# Patient Record
Sex: Male | Born: 1960 | Hispanic: No | Smoking: Never smoker
Health system: Southern US, Community
[De-identification: ages and names within clinical notes are randomized; demographics above are authoritative.]

---
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
## 1982-01-19 HISTORY — PX: NASAL SEPTUM SURGERY: SHX37

## 2012-10-12 ENCOUNTER — Institutional Professional Consult (permissible substitution): Payer: Self-pay | Admitting: Sports Medicine

## 2012-10-13 ENCOUNTER — Encounter: Payer: Self-pay | Admitting: Sports Medicine

## 2012-10-14 ENCOUNTER — Ambulatory Visit (INDEPENDENT_AMBULATORY_CARE_PROVIDER_SITE_OTHER): Payer: BC Managed Care – PPO

## 2012-10-14 ENCOUNTER — Ambulatory Visit (INDEPENDENT_AMBULATORY_CARE_PROVIDER_SITE_OTHER): Payer: BC Managed Care – PPO | Admitting: Sports Medicine

## 2012-10-14 ENCOUNTER — Encounter: Payer: Self-pay | Admitting: Sports Medicine

## 2012-10-14 ENCOUNTER — Telehealth: Payer: Self-pay | Admitting: *Deleted

## 2012-10-14 VITALS — BP 128/85 | HR 84 | Wt 217.0 lb

## 2012-10-14 DIAGNOSIS — S62143A Displaced fracture of body of hamate [unciform] bone, unspecified wrist, initial encounter for closed fracture: Secondary | ICD-10-CM

## 2012-10-14 DIAGNOSIS — W19XXXA Unspecified fall, initial encounter: Secondary | ICD-10-CM

## 2012-10-14 DIAGNOSIS — M79642 Pain in left hand: Secondary | ICD-10-CM

## 2012-10-14 DIAGNOSIS — M79609 Pain in unspecified limb: Secondary | ICD-10-CM

## 2012-10-14 DIAGNOSIS — M259 Joint disorder, unspecified: Secondary | ICD-10-CM

## 2012-10-14 DIAGNOSIS — S62152A Displaced fracture of hook process of hamate [unciform] bone, left wrist, initial encounter for closed fracture: Secondary | ICD-10-CM | POA: Insufficient documentation

## 2012-10-14 MED ORDER — CELECOXIB 200 MG PO CAPS: ORAL_CAPSULE | ORAL | Status: DC

## 2012-10-14 NOTE — Assessment & Plan Note (Addendum)
Pain is localized on the volar aspect near the scapholunate joint. On his x-rays I do see what may be a small chip on the medial aspect of the scaphoid. This may represent refraction artifact, I do need a CT scan of his wrist. Velcro wrist brace. Celebrex. Return in 2 weeks is still having pain.  CT scan of the wrist review, there is a fracture through the hook of the hamate, scaphoid appears normal. He will return for cast placement next week, hamate fractures can remain chronically symptomatic, if no improvement after 6-8 weeks of immobilization we can consider referral for excision of hook of the hamate.  I billed a fracture code for this visit, all subsequent visits for this complaint will be "post-op checks" in the global period.

## 2012-10-14 NOTE — Progress Notes (Addendum)
   Subjective:    I'm seeing this patient as a consultation for:  Dr. Angelena Sole  CC: Left hand injury  HPI: This is a very pleasant 52 year old male, approximately 1-1/2 weeks ago he fell onto an outstretched hand, impacting his extended dorsiflexed wrist on the pavement. He also scraped up his left knee, which is doing well. Now he has persistent pain, not much better, with swelling over the volar proximal hand approximately 1-1-1/2 cm distal to the proximal wrist crease. He did have x-rays that were done and read by the radiologist as negative. Pain is moderate, persistent, localized.  Past medical history, Surgical history, Family history not pertinant except as noted below, Social history, Allergies, and medications have been entered into the medical record, reviewed, and no changes needed.   Review of Systems: No headache, visual changes, nausea, vomiting, diarrhea, constipation, dizziness, abdominal pain, skin rash, fevers, chills, night sweats, weight loss, swollen lymph nodes, body aches, joint swelling, muscle aches, chest pain, shortness of breath, mood changes, visual or auditory hallucinations.   Objective:   General: Well Developed, well nourished, and in no acute distress.  Neuro/Psych: Alert and oriented x3, extra-ocular muscles intact, able to move all 4 extremities, sensation grossly intact. Skin: Warm and dry, no rashes noted.  Respiratory: Not using accessory muscles, speaking in full sentences, trachea midline.  Cardiovascular: Pulses palpable, no extremity edema. Abdomen: Does not appear distended. Left hand: There is mild swelling over the thenar and hyperthenar eminence, there is tenderness to palpation approximately 1 cm distal to the mid portion of the proximal wrist crease approximately in line with the palmaris longus. No pain in the snuff box, negative Watson's test. Neurovascularly intact distally.  X-rays were personally reviewed, there does appear to be a  small chip on the medial aspect of the scaphoid near the lunate. It is unclear as to whether this represents refraction artifact.  I reviewed the CT scan personally, there is a nondisplaced fracture through the hook of the hamate bone.  Impression and Recommendations:   This case required medical decision making of moderate complexity.

## 2012-10-14 NOTE — Telephone Encounter (Signed)
PA obtained for CT wrist LT w/o contrast.  Auth # [id number].  Meyer Cory, LPN

## 2012-10-17 ENCOUNTER — Encounter: Payer: Self-pay | Admitting: Sports Medicine

## 2012-10-17 ENCOUNTER — Ambulatory Visit (INDEPENDENT_AMBULATORY_CARE_PROVIDER_SITE_OTHER): Payer: BC Managed Care – PPO | Admitting: Sports Medicine

## 2012-10-17 VITALS — BP 150/94 | HR 107 | Wt 220.0 lb

## 2012-10-17 DIAGNOSIS — S62152A Displaced fracture of hook process of hamate [unciform] bone, left wrist, initial encounter for closed fracture: Secondary | ICD-10-CM

## 2012-10-17 DIAGNOSIS — S62143A Displaced fracture of body of hamate [unciform] bone, unspecified wrist, initial encounter for closed fracture: Secondary | ICD-10-CM

## 2012-10-17 NOTE — Progress Notes (Signed)
  Subjective: Several days status post nondisplaced fracture through the hook of the hamate bone. Pain is improving.   Objective: General: Well-developed, well-nourished, and in no acute distress.  Tender to palpation over the hook of the hamate over the volar palm.  Short-arm cast was placed.  Assessment/plan:

## 2012-10-17 NOTE — Assessment & Plan Note (Signed)
Return to see me in 3 weeks for cast check. No x-rays needed. I will likely remove the cast at 6 weeks or sooner if needed. At that point if he still has pain we can repeat the CT scan to evaluate healing.

## 2012-11-01 ENCOUNTER — Other Ambulatory Visit: Payer: Self-pay | Admitting: *Deleted

## 2012-11-01 DIAGNOSIS — M79642 Pain in left hand: Secondary | ICD-10-CM

## 2012-11-01 MED ORDER — CELECOXIB 200 MG PO CAPS: ORAL_CAPSULE | ORAL | Status: AC

## 2012-11-02 ENCOUNTER — Telehealth: Payer: Self-pay | Admitting: *Deleted

## 2012-11-02 NOTE — Telephone Encounter (Signed)
PA approved for Celebrex through [facility] from 11/01/12-11/02/14.

## 2012-11-07 ENCOUNTER — Encounter: Payer: Self-pay | Admitting: Sports Medicine

## 2012-11-07 ENCOUNTER — Ambulatory Visit (INDEPENDENT_AMBULATORY_CARE_PROVIDER_SITE_OTHER): Payer: BC Managed Care – PPO | Admitting: Sports Medicine

## 2012-11-07 VITALS — BP 143/88 | HR 92 | Wt 218.0 lb

## 2012-11-07 DIAGNOSIS — S62152A Displaced fracture of hook process of hamate [unciform] bone, left wrist, initial encounter for closed fracture: Secondary | ICD-10-CM

## 2012-11-07 DIAGNOSIS — S62143A Displaced fracture of body of hamate [unciform] bone, unspecified wrist, initial encounter for closed fracture: Secondary | ICD-10-CM

## 2012-11-07 NOTE — Progress Notes (Signed)
  Subjective: Three-week status post fracture of the left hook of the hamate. Doing well, some discomfort in the past.   Objective: General: Well-developed, well-nourished, and in no acute distress. There is an area of skin pressure necrosis on the dorsum of his hand. Cast is removed, there is no tenderness to palpation over the hook of the hamate, there is also no pain referred to the hamate with resisted flexion of the fourth and fifth fingers.  Assessment/plan:

## 2012-11-07 NOTE — Assessment & Plan Note (Signed)
Cast is reapplied, bivalved, with Coban wrap around it, he will go home and then transitioned into a Velcro wrist brace. The wrist was strapped with Coban around the past. I will see him back in 3 weeks.

## 2012-11-28 ENCOUNTER — Ambulatory Visit: Payer: BC Managed Care – PPO | Admitting: Sports Medicine

## 2012-11-28 ENCOUNTER — Encounter: Payer: Self-pay | Admitting: Sports Medicine

## 2012-11-28 VITALS — BP 133/86 | HR 99 | Wt 223.0 lb

## 2012-11-28 DIAGNOSIS — S62152A Displaced fracture of hook process of hamate [unciform] bone, left wrist, initial encounter for closed fracture: Secondary | ICD-10-CM

## 2012-11-28 NOTE — Assessment & Plan Note (Signed)
Fracture is healed, return as needed.

## 2012-11-28 NOTE — Progress Notes (Signed)
  Subjective: 6 weeks s/p fracture of the left hook of the hamate, pain-free a Velcro brace.   Objective: General: Well-developed, well-nourished, and in no acute distress. No tenderness to palpation over the fracture site, excellent motion, excellent strength, no pain with resisted flexion of the fourth and fifth digits.  Assessment/plan:

## 2014-05-22 ENCOUNTER — Ambulatory Visit (INDEPENDENT_AMBULATORY_CARE_PROVIDER_SITE_OTHER): Payer: BLUE CROSS/BLUE SHIELD | Admitting: Sports Medicine

## 2014-05-22 ENCOUNTER — Encounter: Payer: Self-pay | Admitting: Sports Medicine

## 2014-05-22 ENCOUNTER — Ambulatory Visit (INDEPENDENT_AMBULATORY_CARE_PROVIDER_SITE_OTHER): Payer: BLUE CROSS/BLUE SHIELD

## 2014-05-22 VITALS — BP 133/90 | HR 82 | Ht 71.0 in | Wt 229.0 lb

## 2014-05-22 DIAGNOSIS — M7711 Lateral epicondylitis, right elbow: Secondary | ICD-10-CM | POA: Diagnosis not present

## 2014-05-22 DIAGNOSIS — M25521 Pain in right elbow: Secondary | ICD-10-CM

## 2014-05-22 NOTE — Assessment & Plan Note (Addendum)
Post traumatic and present for approximately 8 weeks now. Formal physical therapy, x-rays, elbow sleeve. Return in 6 weeks, if no better injection, afterwards if still no better we will do a platelet rich plasma procedure.

## 2014-05-22 NOTE — Progress Notes (Signed)
  Subjective:    CC: Right elbow pain  HPI: This is a pleasant 54 year old male, for the past 8 weeks he has had repetitive trauma over the lateral aspect of his right elbow with resultant swelling, pain, and difficulty grasping objects. Pain is moderate, persistent, radiating down into the forearm localized directly over the lateral epicondyle.  Past medical history, Surgical history, Family history not pertinant except as noted below, Social history, Allergies, and medications have been entered into the medical record, reviewed, and no changes needed.   Review of Systems: No fevers, chills, night sweats, weight loss, chest pain, or shortness of breath.   Objective:    General: Well Developed, well nourished, and in no acute distress.  Neuro: Alert and oriented x3, extra-ocular muscles intact, sensation grossly intact.  HEENT: Normocephalic, atraumatic, pupils equal round reactive to light, neck supple, no masses, no lymphadenopathy, thyroid nonpalpable.  Skin: Warm and dry, no rashes. Cardiac: Regular rate and rhythm, no murmurs rubs or gallops, no lower extremity edema.  Respiratory: Clear to auscultation bilaterally. Not using accessory muscles, speaking in full sentences. Right Elbow: Minimally swollen over the lateral epicondyles Range of motion full pronation, supination, flexion, extension. Strength is full to all of the above directions Stable to varus, valgus stress. Negative moving valgus stress test. Tender to palpation over the lateral epicondyle with reproduction of pain with resisted extension of the middle finger. Ulnar nerve does not sublux. Negative cubital tunnel Tinel's.  X-rays are unremarkable.  Impression and Recommendations:

## 2014-05-28 ENCOUNTER — Ambulatory Visit (INDEPENDENT_AMBULATORY_CARE_PROVIDER_SITE_OTHER): Payer: BLUE CROSS/BLUE SHIELD | Admitting: Physical Therapy

## 2014-05-28 DIAGNOSIS — M25631 Stiffness of right wrist, not elsewhere classified: Secondary | ICD-10-CM | POA: Diagnosis not present

## 2014-05-28 DIAGNOSIS — M6281 Muscle weakness (generalized): Secondary | ICD-10-CM

## 2014-05-28 DIAGNOSIS — M25521 Pain in right elbow: Secondary | ICD-10-CM

## 2014-05-28 NOTE — Patient Instructions (Signed)
AROM: Wrist Flexion / Extension  Actively bend right wrist forward then back as far as possible. Repeat _10-20___ times per set. Do ____ sets per session. Do _2-3___ sessions per day.  Copyright  VHI. All rights reserved.   AROM: Forearm Pronation / Supination   With right arm in handshake position, slowly rotate Edwards down until stretch is felt. Relax. Then rotate Edwards up until stretch is felt. Repeat 10-20____ times per set. Do ____ sets per session. Do __2__ sessions per day.  Copyright  VHI. All rights reserved.     Wrist Extensor Stretch   Keeping elbow straight, grasp left hand and slowly bend wrist forward until stretch is felt. Hold _30___ seconds. Relax. Repeat __3__ times per set. Do ____ sets per session. Do _2-3_ sessions per day.  Copyright  VHI. All rights reserved.   The following will explain the uses of heat and cold, when to use, and how to use.  Heat: Do not apply heat to new injury. Wait 3-5 days to use heat after initial onset.  After the initial onset, you can use heat even if it returns one year later. Do not use if you have active swelling for day 1-5 of your injury.    Moist heat or electric methods are acceptable. Protect your skin at all times. Apply directly to painful area.   15-20 minutes is adequate.   Check your skin after application. Redness is normal but blistering and/or burns are NOT.     ICE: Use ice immediately after initial injury for day 1-5. Use ice for active swelling up to day 5 which then you can switch to heat if you PREFER. Use ice for chronic pain if you find it helpful.    Commercial ice packs, ziploc bags of ice, ice baths, direct application (ice massage) are all acceptable methods. Protect your skin with towlels for longer applications as you need. Apply directly to painful/swollen area.   If using for swelling/edema, elevating the limb above the heart is beneficial.   10-15 minutes is adequate.   Check your skin  after application. Skin should feel cold and slightly pink.   Physiologic effects of cold: You may feel some different sensations when applying cold especially if using direct application method. - you may initially feel cold - then pain OR burning  - then numbness  All of these sensations are NORMAL, but always use your judgement and if you cannot tolerate any of these sensations stop the application and allow your skin to warm.     Billy PalmJulie Jacalynn Edwards, PT 05/28/2014 3:50 PM  [facility] [address] [place], [place] [address]  [phone] (office) [phone] (fax)

## 2014-05-28 NOTE — Therapy (Signed)
[facility] [address] [place], [place], [address] Phone: [phone]   Fax:  [phone]  Physical Therapy Evaluation  Patient Details  Name: Billy Edwards MRN: [id number] Date of Birth: [birth year] Referring Provider:  Monica Bectonhekkekandam, Thomas J,*  Encounter Date: 05/28/2014      PT End of Session - 05/28/14 1559    Visit Number 1   Number of Visits 8   Date for PT Re-Evaluation 06/25/14   PT Start Time 1522   PT Stop Time 1557   PT Time Calculation (min) 35 min   Activity Tolerance Patient tolerated treatment well   Behavior During Therapy [facility] for tasks assessed/performed      No past medical history on file.  Past Surgical History  Procedure Laterality Date  . Hernia repair  1968  . Nasal septum surgery  1984    There were no vitals filed for this visit.  Visit Diagnosis:  Pain in right elbow - Plan: PT plan of care cert/re-cert  Stiffness of joint, forearm, right - Plan: PT plan of care cert/re-cert  Generalized muscle weakness - Plan: PT plan of care cert/re-cert      Subjective Assessment - 05/28/14 1527    Subjective About 7-8 weeks ago patient hit his elbow on a metal door frame and three days later hit it again. Patient had difficulty lifting his luggage on vacation and then progressed to lifting a glass.  Two weeks ago hurt when he tried to start lawn equipment.  He presently is wearing a compression sleeve to help with the pain.   Patient Stated Goals To get rid of pain and increase strength   Currently in Pain? Yes   Pain Score 6    Pain Location Elbow   Pain Orientation Right   Pain Descriptors / Indicators Aching   Pain Radiating Towards toward hand   Pain Onset More than a month ago   Pain Frequency Intermittent   Aggravating Factors  straightening. resisted activities   Pain Relieving Factors compression sleeve   Multiple Pain Sites No            [facility] PT Assessment - 05/28/14 0001     Assessment   Medical Diagnosis Rt lat epicondylitis   Onset Date 04/02/14   Next MD Visit 6 weeks   Prior Therapy none   Precautions   Precaution Comments limit lifting   Required Braces or Orthoses --  compression sleeve   Balance Screen   Has the patient fallen in the past 6 months No   Has the patient had a decrease in activity level because of a fear of falling?  No   Is the patient reluctant to leave their home because of a fear of falling?  No   Prior Function   Level of Independence Independent with basic ADLs   Vocation Full time employment   Vocation Requirements typing   Observation/Other Assessments   Focus on Therapeutic Outcomes (FOTO)  35% limited (goal 22%)   ROM / Strength   AROM / PROM / Strength AROM;PROM;Strength   AROM   AROM Assessment Site Forearm;Wrist   Right/Left Forearm Right   Right Forearm Pronation --  Full   Right Forearm Supination 48 Degrees   Right/Left Wrist Right   Right Wrist Extension 70 Degrees   Right Wrist Flexion 69 Degrees   Strength   Overall Strength Comments Grip 70/70/75 = 72# on right.  79/74/80= 78# on Left; Pt right hand dominant  Rt  elbow 5/5   Strength Assessment Site Forearm;Wrist   Right/Left Forearm Right   Right/Left Wrist Right   Right Wrist Flexion 5/5   Right Wrist Extension 5/5   Palpation   Palpation Point tenderness of Rt lateral epicondyle; edema noted in Rt forearm and hand likely due to compression sleeve                   [facility] Adult PT Treatment/Exercise - 05/28/14 0001    Exercises   Exercises Wrist   Wrist Exercises   Forearm Supination AROM;Right;15 reps   Forearm Pronation AROM;Right;15 reps   Wrist Flexion AROM;Right;15 reps   Wrist Extension AROM;Right;15 reps   Other wrist exercises wrist flex/ext stretch 1x30 sec ea.   Modalities   Modalities Iontophoresis   Iontophoresis   Type of Iontophoresis Dexamethasone   Location Rt lat epicondyle   Dose 1.0cc   Time 6 hour patch                 PT Education - 05/28/14 1623    Education provided Yes   Education Details HEP; Iontophoresis education of precautions and contraindications   Person(s) Educated Patient   Methods Explanation;Demonstration;Handout   Comprehension Verbalized understanding;Returned demonstration             PT Long Term Goals - 05/28/14 1630    PT LONG TERM GOAL #1   Title I with HEP   Time 4   Period Weeks   Status New   PT LONG TERM GOAL #2   Title able to perform ADLs with 1/10 pain in RT elbow   Time 4   Period Weeks   Status New   PT LONG TERM GOAL #3   Title improved grip strength to 10# greater than left.   Time 4   Period Weeks   Status New   PT LONG TERM GOAL #4   Title improved FOTO to 22%   Time 4   Period Weeks   Status New               Plan - 05/28/14 1624    Clinical Impression Statement Patient is a pleasant 54 yr old male who hit his right elbow three times resulting in pain with resisted activities at the Rt lateral epicondyle. Patient presently wears a compression sleeve whch is causing some residual swelling in the hand. Pt advised to remove sleeve intermittently throughout day. Patient has pain with active supiination as well as resisted wrist extension. He has mild ROM deficits with supination and grip strength deficits on the Rt. Patient would like to return to painfree ADLS.    Pt will benefit from skilled therapeutic intervention in order to improve on the following deficits Decreased range of motion;Impaired flexibility;Increased edema;Decreased activity tolerance;Pain;Impaired UE functional use;Decreased strength;Other (comment)  iontophoresis 4mg /ml dexamethasone   Rehab Potential Excellent   PT Frequency 2x / week   PT Duration 4 weeks   PT Treatment/Interventions ADLs/Self Care Home Management;Patient/family education;Therapeutic exercise;Ultrasound;Manual techniques;Cryotherapy;Electrical Stimulation;Passive range of motion   PT  Next Visit Plan assess ionto, STW, gentle wrist/forearm strengthening, grip strength   Consulted and Agree with Plan of Care Patient         Problem List Patient Active Problem List   Diagnosis Date Noted  . Right lateral epicondylitis 05/22/2014  . Closed fracture of hook of left hamate bone 10/14/2012   Solon PalmJulie Bridgette Wolden PT  05/28/2014, 4:37 PM  [facility] [address] [place], [place], [address] Phone: [phone]   Fax:  [phone]

## 2014-05-30 ENCOUNTER — Ambulatory Visit (INDEPENDENT_AMBULATORY_CARE_PROVIDER_SITE_OTHER): Payer: BLUE CROSS/BLUE SHIELD | Admitting: Physical Therapy

## 2014-05-30 DIAGNOSIS — M25631 Stiffness of right wrist, not elsewhere classified: Secondary | ICD-10-CM

## 2014-05-30 DIAGNOSIS — M6281 Muscle weakness (generalized): Secondary | ICD-10-CM

## 2014-05-30 DIAGNOSIS — M25521 Pain in right elbow: Secondary | ICD-10-CM | POA: Diagnosis not present

## 2014-05-30 NOTE — Therapy (Signed)
[facility] [address] [place], [place], [address] Phone: [phone]   Fax:  [phone]  Physical Therapy Treatment  Patient Details  Name: Billy Edwards Myer MRN: [id number] Date of Birth: [birth year] Referring Provider:  Monica Bectonhekkekandam, Thomas J,*  Encounter Date: 05/30/2014      PT End of Session - 05/30/14 1606    Visit Number 2   Number of Visits 8   Date for PT Re-Evaluation 06/25/14   PT Start Time 1603   PT Stop Time 1656   PT Time Calculation (min) 53 min   Activity Tolerance Patient tolerated treatment well      No past medical history on file.  Past Surgical History  Procedure Laterality Date  . Hernia repair  1968  . Nasal septum surgery  1984    There were no vitals filed for this visit.  Visit Diagnosis:  Pain in right elbow  Stiffness of joint, forearm, right  Generalized muscle weakness      Subjective Assessment - 05/30/14 1606    Subjective Pt reports his elbow is only painful when moving it; no pain at rest.  Pt reports he has complied with HEP.   Currently in Pain? No/denies  up to 4-5/10 with lifting    Pain Location Elbow   Pain Orientation Right   Pain Descriptors / Indicators Aching            [facility] - 05/30/14 0001    Assessment   Medical Diagnosis Rt lat epicondylitis   Onset Date 04/02/14   Next MD Visit 6 weeks                     [facility] Adult PT Treatment/Exercise - 05/30/14 0001    Exercises   Exercises Elbow;Wrist;Hand   Elbow Exercises   Elbow Flexion Right. 2# x 10 reps palm up, x 10 with thumb up    Forearm Supination Strengthening;Right;Both;15 reps;Bar weights/barbell  2#   Forearm Pronation Strengthening;Right;15 reps;Bar weights/barbell  2#   Wrist Flexion Strengthening;Right;10 reps;Theraband  2 sets   Bar Weights/Barbell (Wrist Flexion) 2 lbs   Wrist Extension Strengthening;Right;10 reps;Bar weights/barbell  2 sets   Bar  Weights/Barbell (Wrist Extension) 2 lbs   Hand Exercises   Other Hand Exercises Ball grip (medium) x 25, and finger extension with med grip ball x 25    Wrist Exercises   Other wrist exercises wrist flex and ext stretches 30 sec x 4 reps, pronation with wrist ext stretch x 30 x 2    Modalities   Modalities Iontophoresis;Cryotherapy;Ultrasound   Cryotherapy   Number Minutes Cryotherapy 10 Minutes   Cryotherapy Location --  Rt elbow   Type of Cryotherapy --  vaso, LOW, 3*   Ultrasound   Ultrasound Location Rt lateral epicondyle.    Ultrasound Parameters 50%, 3.3 mHz, 1.1 w/cm2, x 8 min    Ultrasound Goals Edema;Pain   Iontophoresis   Type of Iontophoresis Dexamethasone   Location Rt lat epicondyle    Dose 1.0cc    Time 6 hr patch                 PT Education - 05/30/14 1705    Education provided Yes   Education Details HEP, use of ice 1-2x/day    Person(s) Educated Patient   Methods Handout;Explanation   Comprehension Verbalized understanding;Returned demonstration             PT Long Term Goals - 05/28/14 1630  PT LONG TERM GOAL #1   Title I with HEP   Time 4   Period Weeks   Status New   PT LONG TERM GOAL #2   Title able to perform ADLs with 1/10 pain in RT elbow   Time 4   Period Weeks   Status New   PT LONG TERM GOAL #3   Title improved grip strength to 10# greater than left.   Time 4   Period Weeks   Status New   PT LONG TERM GOAL #4   Title improved FOTO to 22%   Time 4   Period Weeks   Status New               Plan - 05/30/14 1655    Clinical Impression Statement Pt tolerated exercises with minimal increase in pain in Lt lateral elbow, reduced with cryotherapy.  Pt had no skin irritation from last ionto treatment.    Pt will benefit from skilled therapeutic intervention in order to improve on the following deficits Decreased range of motion;Impaired flexibility;Increased edema;Decreased activity tolerance;Pain;Impaired UE  functional use;Decreased strength;Other (comment)   Rehab Potential Excellent   PT Frequency 2x / week   PT Duration 4 weeks   PT Treatment/Interventions ADLs/Self Care Home Management;Patient/family education;Therapeutic exercise;Ultrasound;Manual techniques;Cryotherapy;Electrical Stimulation;Passive range of motion   PT Next Visit Plan Continue ionto, STW, gentle wrist/forearm strengthening, grip strength   Consulted and Agree with Plan of Care Patient        Problem List Patient Active Problem List   Diagnosis Date Noted  . Right lateral epicondylitis 05/22/2014  . Closed fracture of hook of left hamate bone 10/14/2012    Mayer CamelJennifer Carlson-Long, PTA 05/30/2014 5:05 PM  [facility] [address] [place], [place], [address] Phone: [phone]   Fax:  [phone]

## 2014-05-30 NOTE — Patient Instructions (Addendum)
Elbow Flexion: Resisted   With right arm straight, thumb forward, Holding ___2_ pound weight, bend elbow. Return slowly. Repeat _10___ times per set. Do _2___ sets per session. Do __1__ sessions 3x/wk   Wrist Flexion: Resisted   With right palm up, _2___ pound weight in hand, bend wrist up. Return slowly. Repeat _10___ times per set. Do _2-3___ sets per session. Do __1__ sessions, 3x/wk  Wrist Extension: Resisted   With right palm down, _2___ pound weight in hand, bend wrist up. Return slowly. Repeat __10__ times per set. Do __2-3__ sets per session. Do _1___ session, 3x/wk  Follow with wrist stretches.   [facility] [address] [place], [place] [address]  [phone])

## 2014-06-04 ENCOUNTER — Ambulatory Visit (INDEPENDENT_AMBULATORY_CARE_PROVIDER_SITE_OTHER): Payer: BLUE CROSS/BLUE SHIELD | Admitting: Physical Therapy

## 2014-06-04 DIAGNOSIS — M6281 Muscle weakness (generalized): Secondary | ICD-10-CM | POA: Diagnosis not present

## 2014-06-04 DIAGNOSIS — M25521 Pain in right elbow: Secondary | ICD-10-CM | POA: Diagnosis not present

## 2014-06-04 DIAGNOSIS — M25631 Stiffness of right wrist, not elsewhere classified: Secondary | ICD-10-CM | POA: Diagnosis not present

## 2014-06-04 NOTE — Therapy (Signed)
[facility] [address] [place], [place], [address] Phone: [phone]   Fax:  [phone]  Physical Therapy Treatment  Patient Details  Name: Billy Edwards MRN: [id number] Date of Birth: [birth year] Referring Provider:  Monica Bectonhekkekandam, Thomas J,*  Encounter Date: 06/04/2014      PT End of Session - 06/04/14 1612    Visit Number 3   Number of Visits 8   Date for PT Re-Evaluation 06/25/14   PT Start Time 1601      No past medical history on file.  Past Surgical History  Procedure Laterality Date  . Hernia repair  1968  . Nasal septum surgery  1984    There were no vitals filed for this visit.  Visit Diagnosis:  Pain in right elbow  Stiffness of joint, forearm, right  Generalized muscle weakness      Subjective Assessment - 06/04/14 1604    Subjective Pt reports he was sore after last treatment, but feels "marginal difference" improvement when lifting items.    Currently in Pain? No/denies  4/10 when exercising   Pain Location Elbow   Pain Orientation Right   Pain Descriptors / Indicators Dull            [facility] Assessment - 06/04/14 0001    Assessment   Medical Diagnosis Rt lat epicondylitis   Onset Date 04/02/14   Next MD Visit 07/03/14   ROM / Strength   AROM / PROM / Strength AROM;Strength   AROM   AROM Assessment Site Forearm;Wrist   Right/Left Forearm Right;Left   Right Forearm Pronation 80 Degrees   Right Forearm Supination 67 Degrees   Left Forearm Pronation 80 Degrees   Left Forearm Supination 80 Degrees   Right/Left Wrist Right   Right Wrist Extension 65 Degrees   Right Wrist Flexion 70 Degrees   Strength   Strength Assessment Site Hand;Wrist;Forearm   Right/Left Wrist Right   Right Wrist Flexion 5/5   Right Wrist Extension --  5-/5, with pain    Right/Left hand Right;Left   Grip (lbs) 80   Grip (lbs) 80                     [facility] Adult PT Treatment/Exercise -  06/04/14 0001    Exercises   Exercises Hand;Wrist;Elbow   Elbow Exercises   Elbow Flexion Strengthening;Right  thumb up x 10, palm up x 10    Bar Weights/Barbell (Elbow Flexion) 2 lbs   Forearm Supination Strengthening;Right;10 reps;Seated;Bar weights/barbell  3#   Forearm Pronation Strengthening;Right;10 reps  3#   Wrist Flexion Right;Strengthening;10 reps  2 sets   Bar Weights/Barbell (Wrist Flexion) 3 lbs   Wrist Extension Strengthening;Right;10 reps  2 sets   Bar Weights/Barbell (Wrist Extension) 3 lbs   Hand Exercises   Other Hand Exercises Ball grip (medium) x 25, and finger extension with med grip ball x 25    Wrist Exercises   Other wrist exercises wrist flex and ext stretches 30 sec x 4 reps, pronation with wrist ext stretch x 30 x 2    Modalities   Modalities Iontophoresis   Ultrasound   Ultrasound Location Rt lateral epicondyle    Ultrasound Parameters 50%, 3.3 mHz, 1.1 w/cm2, x 8 min    Ultrasound Goals Edema;Pain   Iontophoresis   Type of Iontophoresis Dexamethasone   Location Rt lat epicondyle    Dose 1.0cc    Time 6 hr patch  PT Education - 06/04/14 1702    Education provided Yes   Education Details self care:pt encouraged to perform self massage to lateral epicondyle tendons and ice afterwards.    Person(s) Educated Patient   Methods Explanation   Comprehension Verbalized understanding             PT Long Term Goals - 05/28/14 1630    PT LONG TERM GOAL #1   Title I with HEP   Time 4   Period Weeks   Status New   PT LONG TERM GOAL #2   Title able to perform ADLs with 1/10 pain in RT elbow   Time 4   Period Weeks   Status New   PT LONG TERM GOAL #3   Title improved grip strength to 10# greater than left.   Time 4   Period Weeks   Status New   PT LONG TERM GOAL #4   Title improved FOTO to 22%   Time 4   Period Weeks   Status New               Plan - 06/04/14 1656    Clinical Impression Statement Pt  tolerated increase in resistance this session, in all but elbow flexion with thumb up (stayed at 2#). Pt reported some decrease in pain in Rt elbow after treatment. Making progress towards goals with improved grip strength.    Pt will benefit from skilled therapeutic intervention in order to improve on the following deficits Decreased range of motion;Impaired flexibility;Increased edema;Decreased activity tolerance;Pain;Impaired UE functional use;Decreased strength;Other (comment)   Rehab Potential Excellent   PT Frequency 2x / week   PT Duration 4 weeks   PT Treatment/Interventions ADLs/Self Care Home Management;Patient/family education;Therapeutic exercise;Ultrasound;Manual techniques;Cryotherapy;Electrical Stimulation;Passive range of motion   PT Next Visit Plan Continue ionto, STW, gentle wrist/forearm strengthening, grip strength   Consulted and Agree with Plan of Care Patient        Problem List Patient Active Problem List   Diagnosis Date Noted  . Right lateral epicondylitis 05/22/2014  . Closed fracture of hook of left hamate bone 10/14/2012   Mayer CamelJennifer Carlson-Long, PTA 06/04/2014 5:03 PM  [facility] [address] [place], [place], [address] Phone: [phone]   Fax:  [phone]

## 2014-06-06 ENCOUNTER — Ambulatory Visit (INDEPENDENT_AMBULATORY_CARE_PROVIDER_SITE_OTHER): Payer: BLUE CROSS/BLUE SHIELD | Admitting: Physical Therapy

## 2014-06-06 ENCOUNTER — Encounter: Payer: BLUE CROSS/BLUE SHIELD | Admitting: Physical Therapy

## 2014-06-06 DIAGNOSIS — M25631 Stiffness of right wrist, not elsewhere classified: Secondary | ICD-10-CM | POA: Diagnosis not present

## 2014-06-06 DIAGNOSIS — M6281 Muscle weakness (generalized): Secondary | ICD-10-CM

## 2014-06-06 DIAGNOSIS — M25521 Pain in right elbow: Secondary | ICD-10-CM | POA: Diagnosis not present

## 2014-06-06 NOTE — Therapy (Signed)
[facility] [address] [place], [place], [address] Phone: [phone]   Fax:  [phone]  Physical Therapy Treatment  Patient Details  Name: Billy Edwards MRN: [id number] Date of Birth: [birth year] Referring Provider:  Monica Bectonhekkekandam, Thomas J,*  Encounter Date: 06/06/2014      PT End of Session - 06/06/14 1529    Visit Number 4   Number of Visits 8   Date for PT Re-Evaluation 06/25/14   PT Start Time 1448   PT Stop Time 1528   PT Time Calculation (min) 40 min   Activity Tolerance Patient tolerated treatment well      No past medical history on file.  Past Surgical History  Procedure Laterality Date  . Hernia repair  1968  . Nasal septum surgery  1984    There were no vitals filed for this visit.  Visit Diagnosis:  Pain in right elbow  Stiffness of joint, forearm, right  Generalized muscle weakness      Subjective Assessment - 06/06/14 1702    Subjective Pt reports he is noticing less pain with lifting today.  Feels things are improving. Still pain only when lifiting with R elbow.    Currently in Pain? No/denies  3/10 with exercise.  6/10 with palpation to area            [facility] Assessment - 06/06/14 0001    Assessment   Medical Diagnosis Rt lat epicondylitis   Onset Date 04/02/14   Next MD Visit 07/03/14                     [facility] Adult PT Treatment/Exercise - 06/06/14 0001    Exercises   Exercises Hand;Elbow;Wrist   Elbow Exercises   Elbow Flexion Strengthening;Right;10 reps;Bar weights/barbell  x 10 thumb up, x 10 palm up. x 5 thumb up 4#, x 5 reps 5#   Bar Weights/Barbell (Elbow Flexion) 3 lbs;4 lbs;5 lbs   Elbow Extension Right;10 reps   Theraband Level (Elbow Extension) Level 2 (Red)   Forearm Supination Right;Strengthening  2 sets of 10   Forearm Pronation Strengthening;Right  2 sets of 10 3#   Wrist Flexion Strengthening;Right  2 sets of 12   Bar Weights/Barbell  (Wrist Flexion) 3 lbs   Wrist Extension --  12 reps x 2 sets    Bar Weights/Barbell (Wrist Extension) 3 lbs   Other elbow exercises elbow extension stretch x 30 sec x 2 (RUE)   Hand Exercises   Other Hand Exercises Ball grip (medium) x 25, and finger extension with med grip ball x 25    Wrist Exercises   Wrist Radial Deviation --  12 x 2 sets    Bar Weights/Barbell (Radial Deviation) 3 lbs   Wrist Ulnar Deviation Right  12 x 2 sets   Bar Weights/Barbell (Ulnar Deviation) 3 lbs   Other wrist exercises wrist flex and ext stretches 30 sec x 4 reps, pronation with wrist ext stretch x 30 x 2    Modalities   Modalities Iontophoresis;Ultrasound   Ultrasound   Ultrasound Location Rt lateral epicondyle    Ultrasound Parameters 50%, 3.3 mHz, 1.1 w/cm2, x 8 min    Ultrasound Goals Edema;Pain   Iontophoresis   Type of Iontophoresis Dexamethasone   Location Rt lat epicondyle    Dose 1.0 cc    Time 6 hr patch  PT Long Term Goals - 05/28/14 1630    PT LONG TERM GOAL #1   Title I with HEP   Time 4   Period Weeks   Status New   PT LONG TERM GOAL #2   Title able to perform ADLs with 1/10 pain in RT elbow   Time 4   Period Weeks   Status New   PT LONG TERM GOAL #3   Title improved grip strength to 10# greater than left.   Time 4   Period Weeks   Status New   PT LONG TERM GOAL #4   Title improved FOTO to 22%   Time 4   Period Weeks   Status New               Plan - 06/06/14 1659    Clinical Impression Statement Pt able to tolerate increased resistance this session without pain, just fatigue.  Making good progress towards goals.    Pt will benefit from skilled therapeutic intervention in order to improve on the following deficits Decreased range of motion;Impaired flexibility;Increased edema;Decreased activity tolerance;Pain;Impaired UE functional use;Decreased strength;Other (comment)   Rehab Potential Excellent   PT Frequency 2x / week    PT Duration 4 weeks   PT Next Visit Plan Continue one more ionto session, STW, gentle wrist/forearm strengthening, grip strength   Consulted and Agree with Plan of Care Patient        Problem List Patient Active Problem List   Diagnosis Date Noted  . Right lateral epicondylitis 05/22/2014  . Closed fracture of hook of left hamate bone 10/14/2012   Mayer CamelJennifer Carlson-Long, PTA 06/06/2014 5:03 PM  [facility] [address] [place] [address] [place], [place], [address] Phone: [phone]   Fax:  [phone]

## 2014-06-11 ENCOUNTER — Ambulatory Visit (INDEPENDENT_AMBULATORY_CARE_PROVIDER_SITE_OTHER): Payer: BLUE CROSS/BLUE SHIELD | Admitting: Physical Therapy

## 2014-06-11 DIAGNOSIS — M25521 Pain in right elbow: Secondary | ICD-10-CM | POA: Diagnosis not present

## 2014-06-11 DIAGNOSIS — M6281 Muscle weakness (generalized): Secondary | ICD-10-CM | POA: Diagnosis not present

## 2014-06-11 DIAGNOSIS — M25631 Stiffness of right wrist, not elsewhere classified: Secondary | ICD-10-CM | POA: Diagnosis not present

## 2014-06-11 NOTE — Therapy (Signed)
[facility] [place] [address] [place], [place], [address] Phone: [phone]   Fax:  [phone]  Physical Therapy Treatment  Patient Details  Name: Billy Edwards MRN: [id number] Date of Birth: [birth year] Referring Provider:  Monica Becton,*  Encounter Date: 06/11/2014      PT End of Session - 06/11/14 1430    Visit Number 5   Number of Visits 8   Date for PT Re-Evaluation 06/25/14   PT Start Time 1431   PT Stop Time 1519   PT Time Calculation (min) 48 min      No past medical history on file.  Past Surgical History  Procedure Laterality Date  . Hernia repair  1968  . Nasal septum surgery  1984    There were no vitals filed for this visit.  Visit Diagnosis:  Stiffness of joint, forearm, right  Generalized muscle weakness  Pain in right elbow      Subjective Assessment - 06/11/14 1433    Subjective Pt reports his Rt elbow is "seizing up" after icing. Lifting light items is getting easier (milk jug).  Elbow flexion with thumb neutral still "bothersome".    Currently in Pain? No/denies  up to 3/10 with exercise and 4/10 with palpation              [facility] PT Assessment - 06/11/14 0001    Assessment   Medical Diagnosis Rt lat epicondylitis   Onset Date/Surgical Date 04/02/14   Next MD Visit 07/03/14   ROM / Strength   AROM / PROM / Strength AROM;Strength   AROM   AROM Assessment Site Wrist;Forearm   Right/Left Forearm Right   Right Forearm Pronation 80 Degrees   Right Forearm Supination 72 Degrees   Right Wrist Extension 82 Degrees   Right Wrist Flexion 74 Degrees   Strength   Strength Assessment Site Wrist   Right/Left Wrist Right   Right Wrist Flexion 5/5   Right Wrist Extension --  5-/5                     [facility] Adult PT Treatment/Exercise - 06/11/14 0001    Exercises   Exercises Hand;Wrist;Elbow   Elbow Exercises   Elbow Flexion Strengthening;Right;10 reps  1 set with  green band x 5 reps   Bar Weights/Barbell (Elbow Flexion) 4 lbs;5 lbs   Elbow Extension Strengthening;Right;10 reps;Seated  1 set of 5 reps with green band    Bar Weights/Barbell (Elbow Extension) 4 lbs   Forearm Supination Strengthening;Right;Seated;Bar weights/barbell;20 reps  4#   Forearm Pronation Strengthening;Right;20 reps;Seated;Bar weights/barbell  4#   Wrist Flexion Strengthening;Right;10 reps;Seated;Bar weights/barbell   Bar Weights/Barbell (Wrist Flexion) 4 lbs   Wrist Extension Strengthening;Right;10 reps;Bar weights/barbell   Bar Weights/Barbell (Wrist Extension) 4 lbs   Hand Exercises   Other Hand Exercises ball grip (firm) with finger extension x 25    Wrist Exercises   Wrist Radial Deviation Strengthening;Right;10 reps;Seated;Bar weights/barbell   Bar Weights/Barbell (Radial Deviation) 4 lbs   Wrist Ulnar Deviation Strengthening;Right;10 reps;Seated;Bar weights/barbell   Bar Weights/Barbell (Ulnar Deviation) 4 lbs   Modalities   Modalities Cryotherapy;Iontophoresis   Ultrasound   Ultrasound Location Rt lateral epicondyle and proximal ext tendons   Ultrasound Parameters 50%, 1.0 mHz, 1.1 w/cm2    Ultrasound Goals Edema;Pain   Iontophoresis   Type of Iontophoresis Dexamethasone   Location Rt lateral episode    Dose 1.0 cc    Time 6 hr patch  PT Education - 06/11/14 1703    Education provided Yes   Education Details issued green band for elbow flex and ext.    Person(s) Educated Patient   Methods Explanation;Handout   Comprehension Verbalized understanding;Returned demonstration             PT Long Term Goals - 05/28/14 1630    PT LONG TERM GOAL #1   Title I with HEP   Time 4   Period Weeks   Status New   PT LONG TERM GOAL #2   Title able to perform ADLs with 1/10 pain in RT elbow   Time 4   Period Weeks   Status New   PT LONG TERM GOAL #3   Title improved grip strength to 10# greater than left.   Time 4   Period Weeks    Status New   PT LONG TERM GOAL #4   Title improved FOTO to 22%   Time 4   Period Weeks   Status New               Plan - 06/11/14 1601    Clinical Impression Statement Pt reported decreased pain with palpation compared to last session. Pt demo improved wrist ext ROM and strength.  Pt progressing towards goals.    Pt will benefit from skilled therapeutic intervention in order to improve on the following deficits Decreased range of motion;Impaired flexibility;Increased edema;Decreased activity tolerance;Pain;Impaired UE functional use;Decreased strength;Other (comment)   Rehab Potential Excellent   PT Frequency 2x / week   PT Duration 4 weeks   PT Treatment/Interventions ADLs/Self Care Home Management;Patient/family education;Therapeutic exercise;Ultrasound;Manual techniques;Cryotherapy;Electrical Stimulation;Passive range of motion   PT Next Visit Plan continue progressive strengthening of Rt wrist/forearm.    Consulted and Agree with Plan of Care Patient        Problem List Patient Active Problem List   Diagnosis Date Noted  . Right lateral epicondylitis 05/22/2014  . Closed fracture of hook of left hamate bone 10/14/2012   Mayer CamelJennifer Carlson-Long, PTA 06/11/2014 5:03 PM  [facility] [address] [place] [address] [place], [place], [address] Phone: [phone]   Fax:  [phone]

## 2014-06-13 ENCOUNTER — Ambulatory Visit (INDEPENDENT_AMBULATORY_CARE_PROVIDER_SITE_OTHER): Payer: BLUE CROSS/BLUE SHIELD | Admitting: Physical Therapy

## 2014-06-13 DIAGNOSIS — M6281 Muscle weakness (generalized): Secondary | ICD-10-CM

## 2014-06-13 DIAGNOSIS — M25631 Stiffness of right wrist, not elsewhere classified: Secondary | ICD-10-CM

## 2014-06-13 DIAGNOSIS — M25521 Pain in right elbow: Secondary | ICD-10-CM | POA: Diagnosis not present

## 2014-06-13 NOTE — Therapy (Signed)
[facility] [address] [place] [address] [place], [place], [address] Phone: [phone]   Fax:  [phone]  Physical Therapy Treatment  Patient Details  Name: Billy Edwards MRN: [id number] Date of Birth: 1960-03-19 Referring Provider:  Monica Bectonhekkekandam, Thomas J,*  Encounter Date: 06/13/2014      PT End of Session - 06/13/14 1523    Visit Number 6   Number of Visits 8   Date for PT Re-Evaluation 06/25/14   PT Start Time 1520   PT Stop Time 1607   PT Time Calculation (min) 47 min      No past medical history on file.  Past Surgical History  Procedure Laterality Date  . Hernia repair  1968  . Nasal septum surgery  1984    There were no vitals filed for this visit.  Visit Diagnosis:  Stiffness of joint, forearm, right  Generalized muscle weakness  Pain in right elbow      Subjective Assessment - 06/13/14 1707    Subjective Nothing new to report.    Currently in Pain? No/denies  up to 3-4/10 with exercise/lifting            [facility] - 06/13/14 0001    Assessment   Medical Diagnosis Rt lat epicondylitis   Onset Date/Surgical Date 04/02/14   Next MD Visit 07/03/14                     [facility] Adult PT Treatment/Exercise - 06/13/14 0001    Exercises   Exercises Shoulder;Elbow;Wrist;Hand   Elbow Exercises   Elbow Flexion Right;10 reps;Strengthening  palm up    Theraband Level (Elbow Flexion) Level 3 (Green)  standing   Bar Weights/Barbell (Elbow Flexion) 5 lbs  thumb up, 2 sets   Elbow Extension Strengthening;Right;10 reps  with band x 10 reps   Theraband Level (Elbow Extension) Level 3 (Green)   Bar Weights/Barbell (Elbow Extension) 5 lbs  arm flex to 150 deg, x 10   Wrist Flexion Strengthening;Right;15 reps;Seated   Bar Weights/Barbell (Wrist Flexion) 5 lbs   Wrist Extension Strengthening;Right;15 reps   Bar Weights/Barbell (Wrist Extension) 4 lbs   Shoulder Exercises: ROM/Strengthening    UBE (Upper Arm Bike) Level 5: 2 min each way    Wall Pushups 10 reps  10x-elbows out, 10x elbows in.    Modified Plank --  hand walking 3 ft x 2, hands on low mat table   Hand Exercises   Other Hand Exercises ball grip (firm) with finger extension x 20 with 3 sec hold in each direction    Wrist Exercises   Other wrist exercises wrist flex and ext stretches 30 sec x 4 reps, pronation with wrist ext stretch x 30 x 2    Modalities   Modalities Ultrasound;Cryotherapy   Cryotherapy   Number Minutes Cryotherapy 5 Minutes   Cryotherapy Location --  Rt epicondyle   Type of Cryotherapy Ice massage   Ultrasound   Ultrasound Location Rt lateral epicondyle    Ultrasound Parameters 50%, 1.1 w/cm2, 3.3 mHz, 8 min    Ultrasound Goals Pain;Edema                     PT Long Term Goals - 06/13/14 1703    PT LONG TERM GOAL #1   Title I with HEP   Time 4   Period Weeks   Status New   PT LONG TERM GOAL #2   Title able to perform ADLs with  1/10 pain in RT elbow   Time 4   Period Weeks   Status On-going   PT LONG TERM GOAL #3   Title improved grip strength to 10# greater than left.   Time 4   Period Weeks   Status On-going   PT LONG TERM GOAL #4   Title improved FOTO to 22%   Time 4   Period Weeks   Status On-going               Plan - 06/13/14 1708    Clinical Impression Statement Pt able to tolerate increased resistance with ther ex. Pt not as point tender with palpation as in the past. Progressing towards goals.    Pt will benefit from skilled therapeutic intervention in order to improve on the following deficits Decreased range of motion;Impaired flexibility;Increased edema;Decreased activity tolerance;Pain;Impaired UE functional use;Decreased strength;Other (comment)   Rehab Potential Excellent   PT Frequency 2x / week   PT Duration 4 weeks   PT Treatment/Interventions ADLs/Self Care Home Management;Patient/family education;Therapeutic  exercise;Ultrasound;Manual techniques;Cryotherapy;Electrical Stimulation;Passive range of motion   PT Next Visit Plan continue progressive strengthening of Rt wrist/forearm.    Consulted and Agree with Plan of Care Patient        Problem List Patient Active Problem List   Diagnosis Date Noted  . Right lateral epicondylitis 05/22/2014  . Closed fracture of hook of left hamate bone 10/14/2012   Mayer Camel, PTA 06/13/2014 5:08 PM  [facility] [place] [address] [place], [place], [address] Phone: [phone]   Fax:  [phone]

## 2014-06-20 ENCOUNTER — Ambulatory Visit (INDEPENDENT_AMBULATORY_CARE_PROVIDER_SITE_OTHER): Payer: BLUE CROSS/BLUE SHIELD | Admitting: Physical Therapy

## 2014-06-20 DIAGNOSIS — M25631 Stiffness of right wrist, not elsewhere classified: Secondary | ICD-10-CM | POA: Diagnosis not present

## 2014-06-20 DIAGNOSIS — M6281 Muscle weakness (generalized): Secondary | ICD-10-CM | POA: Diagnosis not present

## 2014-06-20 DIAGNOSIS — M25521 Pain in right elbow: Secondary | ICD-10-CM

## 2014-06-20 NOTE — Therapy (Signed)
[place], [place], [address] Phone: [phone]   Fax:  [phone]  Physical Therapy Treatment  Patient Details  Name: Billy Edwards MRN: [id number] Date of Birth: 05-May-1960 Referring Provider:  Silverio Decamp,*  Encounter Date: 06/20/2014      PT End of Session - 06/20/14 1523    Visit Number 7   Number of Visits 8   Date for PT Re-Evaluation 06/25/14   PT Start Time 1017   PT Stop Time 1600   PT Time Calculation (min) 43 min   Activity Tolerance Patient tolerated treatment well      No past medical history on file.  Past Surgical History  Procedure Laterality Date  . Hernia repair  1968  . Nasal septum surgery  1984    There were no vitals filed for this visit.  Visit Diagnosis:  Stiffness of joint, forearm, right  Generalized muscle weakness  Pain in right elbow      Subjective Assessment - 06/20/14 1517    Subjective "I think we're getting better".  Pt hoed garden Saturday, was very sore Sunday. Has stopped wearing compression sleeve, "I feel like I have more movement without it"    Currently in Pain? No/denies  reports up to 2/10 with exercising/lifting            [facility] PT Assessment - 06/20/14 0001    Assessment   Medical Diagnosis Rt lat epicondylitis   Onset Date/Surgical Date 04/02/14   Next MD Visit 07/03/14   Strength   Right/Left hand Right;Left   Right Hand Grip (lbs) 100   Left Hand Grip (lbs) 90                     [facility] Adult PT Treatment/Exercise - 06/20/14 0001    Exercises   Exercises Elbow;Hand;Wrist   Elbow Exercises   Elbow Flexion Right   Bar Weights/Barbell (Elbow Flexion) --  6#, Thumb up x 15, palm up x 15   Elbow Extension Strengthening;Right;15 reps   Theraband Level (Elbow Extension) Level 4 (Blue)   Forearm Supination Right;10 reps;Bar weights/barbell  5#   Forearm Pronation Right;Strengthening;10 reps;Seated;Bar  weights/barbell  5#   Wrist Flexion Strengthening;Right;5 reps  2 sets   Bar Weights/Barbell (Wrist Flexion) 5 lbs   Wrist Extension Right;5 reps;Seated  2 sets   Bar Weights/Barbell (Wrist Extension) 5 lbs   Other elbow exercises elbow extension stretch x 30 sec x 4, flexion stretch x 30 sec x 4 reps (RUE)   Hand Exercises   Other Hand Exercises ball grip (firm) with finger extension x 20 with 3 sec hold in each direction    Cryotherapy   Number Minutes Cryotherapy 5 Minutes   Cryotherapy Location --  Rt lateral epicondyle   Type of Cryotherapy Ice massage   Ultrasound   Ultrasound Location Rt lateral epicondyle    Ultrasound Parameters 50%, 1.3 w/cm2, 3.3 mhz, x 8 min    Ultrasound Goals Pain;Edema                     PT Long Term Goals - 06/20/14 1523    PT LONG TERM GOAL #1   Title I with HEP   Time 4   Period Weeks   Status On-going   PT LONG TERM GOAL #2   Title able to perform ADLs with 1/10 pain in RT elbow   Time 4   Period Weeks  Status Achieved   PT LONG TERM GOAL #3   Title improved grip strength to 10# greater than left.   Time 4   Period Weeks   Status Achieved   PT LONG TERM GOAL #4   Title improved FOTO to 22%   Time 4   Period Weeks   Status On-going               Plan - 06/20/14 1603    Clinical Impression Statement Pt tolerated increased resistance with elbow and wrist therapeutic exercises; only mild pain with wrist extension - resolved with stretches and ice. Pt demo improved hand grip.  Has met LTG # 2 +3.    Pt will benefit from skilled therapeutic intervention in order to improve on the following deficits Decreased range of motion;Impaired flexibility;Increased edema;Decreased activity tolerance;Pain;Impaired UE functional use;Decreased strength;Other (comment)   Rehab Potential Excellent   PT Frequency 2x / week   PT Duration 4 weeks   PT Treatment/Interventions ADLs/Self Care Home Management;Patient/family  education;Therapeutic exercise;Ultrasound;Manual techniques;Cryotherapy;Electrical Stimulation;Passive range of motion   PT Next Visit Plan Reassess and d/c to HEP if appropriate. Advance HEP. Pt to complete FOTO.    Consulted and Agree with Plan of Care Patient        Problem List Patient Active Problem List   Diagnosis Date Noted  . Right lateral epicondylitis 05/22/2014  . Closed fracture of hook of left hamate bone 10/14/2012   Kerin Perna, PTA 06/20/2014 4:08 PM  [place], [place], [address] Phone: [phone]   Fax:  [phone]

## 2014-06-22 ENCOUNTER — Encounter: Payer: BLUE CROSS/BLUE SHIELD | Admitting: Physical Therapy

## 2014-06-25 ENCOUNTER — Ambulatory Visit (INDEPENDENT_AMBULATORY_CARE_PROVIDER_SITE_OTHER): Payer: BLUE CROSS/BLUE SHIELD | Admitting: Physical Therapy

## 2014-06-25 DIAGNOSIS — M25631 Stiffness of right wrist, not elsewhere classified: Secondary | ICD-10-CM | POA: Diagnosis not present

## 2014-06-25 DIAGNOSIS — M25521 Pain in right elbow: Secondary | ICD-10-CM

## 2014-06-25 DIAGNOSIS — M6281 Muscle weakness (generalized): Secondary | ICD-10-CM

## 2014-06-25 NOTE — Therapy (Addendum)
[place], [place], [address] Phone: [phone]   Fax:  [phone]  Physical Therapy Treatment  Patient Details  Name: Samuele Storey MRN: [id number] Date of Birth: 02-Jul-1960 Referring Provider:  Silverio Decamp,*  Encounter Date: 06/25/2014      PT End of Session - 06/25/14 1526    Visit Number 8   Number of Visits 8   Date for PT Re-Evaluation 06/25/14   PT Start Time 1520   PT Stop Time 1558   PT Time Calculation (min) 38 min   Activity Tolerance Patient tolerated treatment well;No increased pain      No past medical history on file.  Past Surgical History  Procedure Laterality Date  . Hernia repair  1968  . Nasal septum surgery  1984    There were no vitals filed for this visit.  Visit Diagnosis:  Stiffness of joint, forearm, right  Generalized muscle weakness  Pain in right elbow      Subjective Assessment - 06/25/14 1602    Subjective Pt reports he feels "almost 100% better"    Currently in Pain? No/denies            [facility] PT Assessment - 06/25/14 0001    Assessment   Medical Diagnosis Rt lat epicondylitis   Onset Date/Surgical Date 04/02/14   Next MD Visit 07/03/14   Observation/Other Assessments   Focus on Therapeutic Outcomes (FOTO)  23% limitation (22% goal)   AROM   AROM Assessment Site Forearm   Right/Left Forearm Right   Right Forearm Pronation 90 Degrees   Right Forearm Supination 71 Degrees   Right/Left Wrist Right   Right Wrist Extension 80 Degrees   Right Wrist Flexion 82 Degrees            [facility] Adult PT Treatment/Exercise - 06/25/14 0001    Exercises   Exercises Wrist;Hand;Elbow   Elbow Exercises   Elbow Flexion Right   Bar Weights/Barbell (Elbow Flexion) --  7#, thumb up x 15, palm up x 15, 8# x 5 each way   Elbow Extension Strengthening;Right;Standing;15 reps   Theraband Level (Elbow Extension) Level 4 (Blue)   Wrist Flexion  Strengthening;Right;10 reps  2 sets   Bar Weights/Barbell (Wrist Flexion) 5 lbs   Wrist Extension Right;Strengthening;10 reps  2 sets   Bar Weights/Barbell (Wrist Extension) 5 lbs   Shoulder Exercises: Stretch   Other Shoulder Stretches elbow ext shoulder ext stretch x 30 sec x 2   Other Shoulder Stretches shoulder ext (overhead) x 30 sec x 1 reps    Cryotherapy   Number Minutes Cryotherapy 5 Minutes   Cryotherapy Location --  Rt lateral epicondyle   Type of Cryotherapy Ice massage   Manual Therapy   Manual Therapy --                PT Education - 06/25/14 1601    Education provided Yes   Education Details issued blue band for elbow flex and ext.    Person(s) Educated Patient   Methods Explanation;Demonstration   Comprehension Verbalized understanding;Returned demonstration             PT Long Term Goals - 06/25/14 1610    PT LONG TERM GOAL #1   Title I with HEP   Time 4   Period Weeks   Status Achieved   PT LONG TERM GOAL #2   Title able to perform ADLs with 1/10 pain in RT elbow   Time  4   Period Weeks   Status Achieved   PT LONG TERM GOAL #3   Title improved grip strength to 10# greater than left.   Time 4   Period Weeks   Status Achieved   PT LONG TERM GOAL #4   Title improved FOTO to 22%   Time 4   Status Not Met  scored 23% on d/c.                Plan - 06/25/14 1608    Clinical Impression Statement Pt tolerated increased resistance and reps without increased pain.  Pt has partially met his goals;  is satisfied with his current level of progress. Pt reports he is ready to d/c to HEP.    Pt will benefit from skilled therapeutic intervention in order to improve on the following deficits Decreased range of motion;Impaired flexibility;Increased edema;Decreased activity tolerance;Pain;Impaired UE functional use;Decreased strength;Other (comment)   Rehab Potential Excellent   PT Treatment/Interventions ADLs/Self Care Home  Management;Patient/family education;Therapeutic exercise;Ultrasound;Manual techniques;Cryotherapy;Electrical Stimulation;Passive range of motion   PT Next Visit Plan Spoke to supervising PT; will d/c to HEP.    Consulted and Agree with Plan of Care Patient        Problem List Patient Active Problem List   Diagnosis Date Noted  . Right lateral epicondylitis 05/22/2014  . Closed fracture of hook of left hamate bone 10/14/2012    Kerin Perna, PTA 06/25/2014 4:14 PM  [place], [place], [address] Phone: [phone]   Fax:  [phone]     PHYSICAL THERAPY DISCHARGE SUMMARY  Visits from Start of Care: 8  Current functional level related to goals / functional outcomes: See above   Remaining deficits: See above   Education / Equipment: HEP Plan: Patient agrees to discharge.  Patient goals were partially met. Patient is being discharged due to being pleased with the current functional level.  ?????   Jeral Pinch, PT 06/29/2014 2:06 PM

## 2014-07-03 ENCOUNTER — Encounter: Payer: Self-pay | Admitting: Sports Medicine

## 2014-07-03 ENCOUNTER — Ambulatory Visit (INDEPENDENT_AMBULATORY_CARE_PROVIDER_SITE_OTHER): Payer: BLUE CROSS/BLUE SHIELD | Admitting: Sports Medicine

## 2014-07-03 VITALS — BP 125/75 | HR 108 | Ht 70.0 in | Wt 234.0 lb

## 2014-07-03 DIAGNOSIS — M7711 Lateral epicondylitis, right elbow: Secondary | ICD-10-CM

## 2014-07-03 NOTE — Assessment & Plan Note (Signed)
Clinically resolved after aggressive formal physical therapy. Continues to improve, has not yet plateaued. Return to see me on an as-needed basis.

## 2014-07-03 NOTE — Progress Notes (Signed)
  Subjective:    CC: Follow-up  HPI:  Right lateral epicondylitis: Posttraumatic, improved after 4 weeks of formal physical therapy, approximately 90% better and continuing to improve. No complaints, has graduated to home exercise program.  Past medical history, Surgical history, Family history not pertinant except as noted below, Social history, Allergies, and medications have been entered into the medical record, reviewed, and no changes needed.   Review of Systems: No fevers, chills, night sweats, weight loss, chest pain, or shortness of breath.   Objective:    General: Well Developed, well nourished, and in no acute distress.  Neuro: Alert and oriented x3, extra-ocular muscles intact, sensation grossly intact.  HEENT: Normocephalic, atraumatic, pupils equal round reactive to light, neck supple, no masses, no lymphadenopathy, thyroid nonpalpable.  Skin: Warm and dry, no rashes. Cardiac: Regular rate and rhythm, no murmurs rubs or gallops, no lower extremity edema.  Respiratory: Clear to auscultation bilaterally. Not using accessory muscles, speaking in full sentences. Right Elbow: Unremarkable to inspection. Range of motion full pronation, supination, flexion, extension. Strength is full to all of the above directions Stable to varus, valgus stress. Negative moving valgus stress test. No discrete areas of tenderness to palpation. Ulnar nerve does not sublux. Negative cubital tunnel Tinel's.  Impression and Recommendations:

## 2016-02-10 IMAGING — CR DG ELBOW COMPLETE 3+V*R*
4 series · 4 of 4 positions shown · non-contrast
Comparison: None.

CLINICAL DATA: Pain right elbow.

EXAM:
RIGHT ELBOW - COMPLETE 3+ VIEW

[elbow ap]
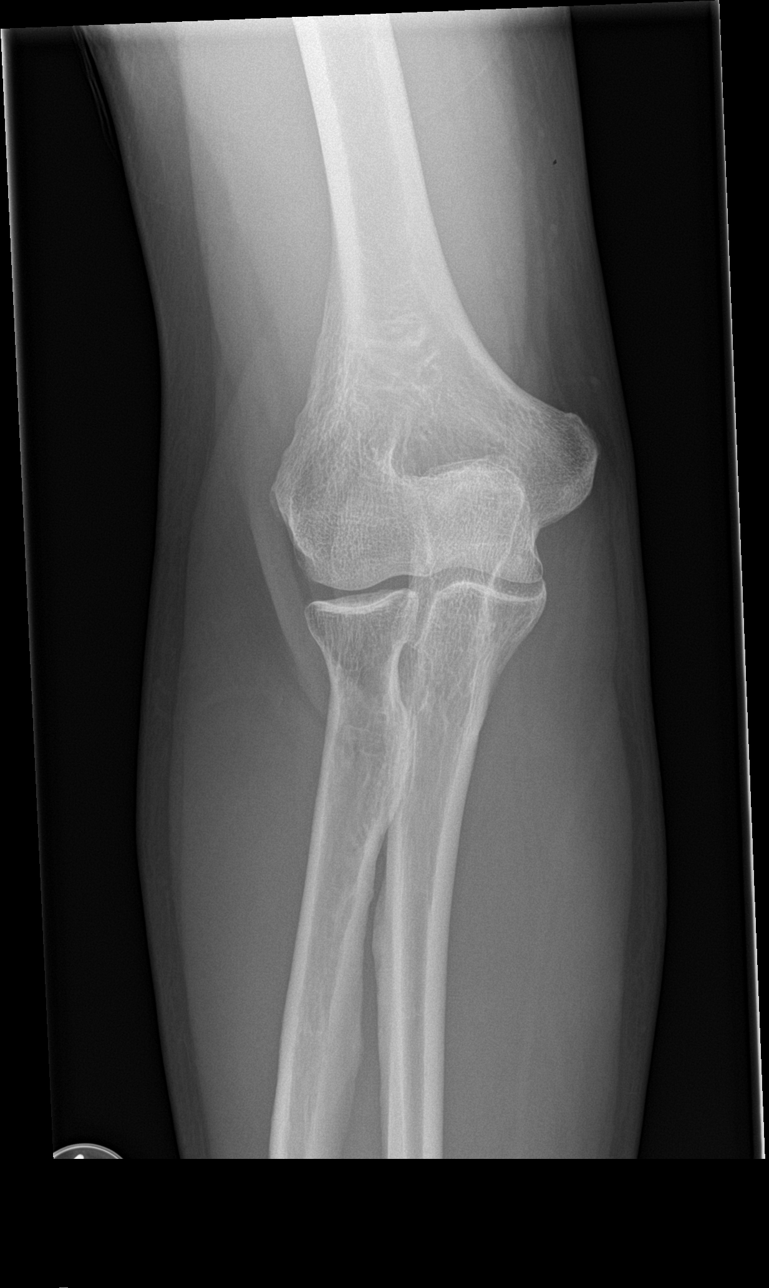

[elbow obl (1 of 2)]
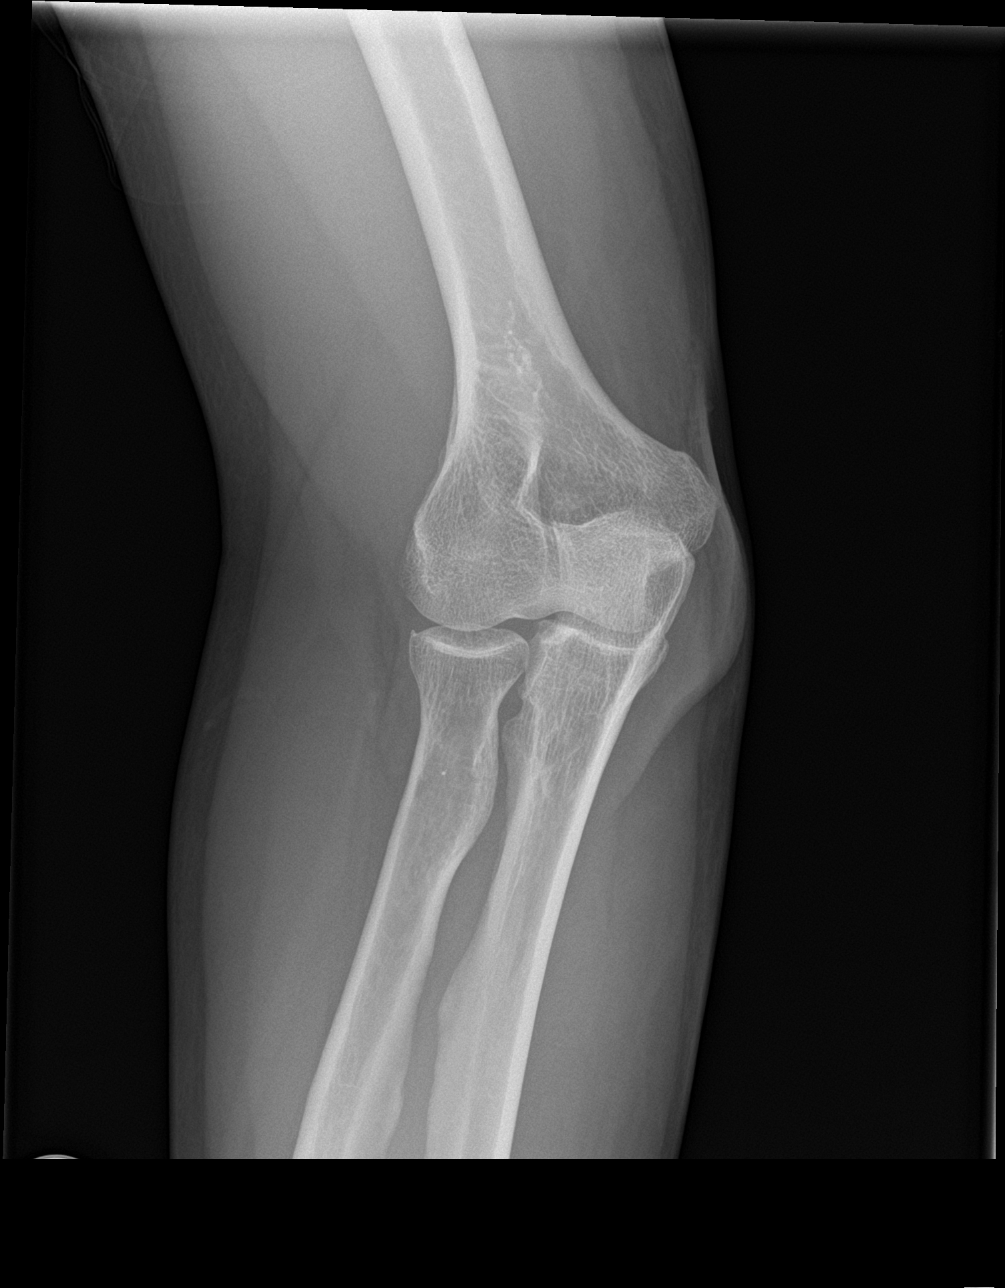

[elbow obl (2 of 2)]
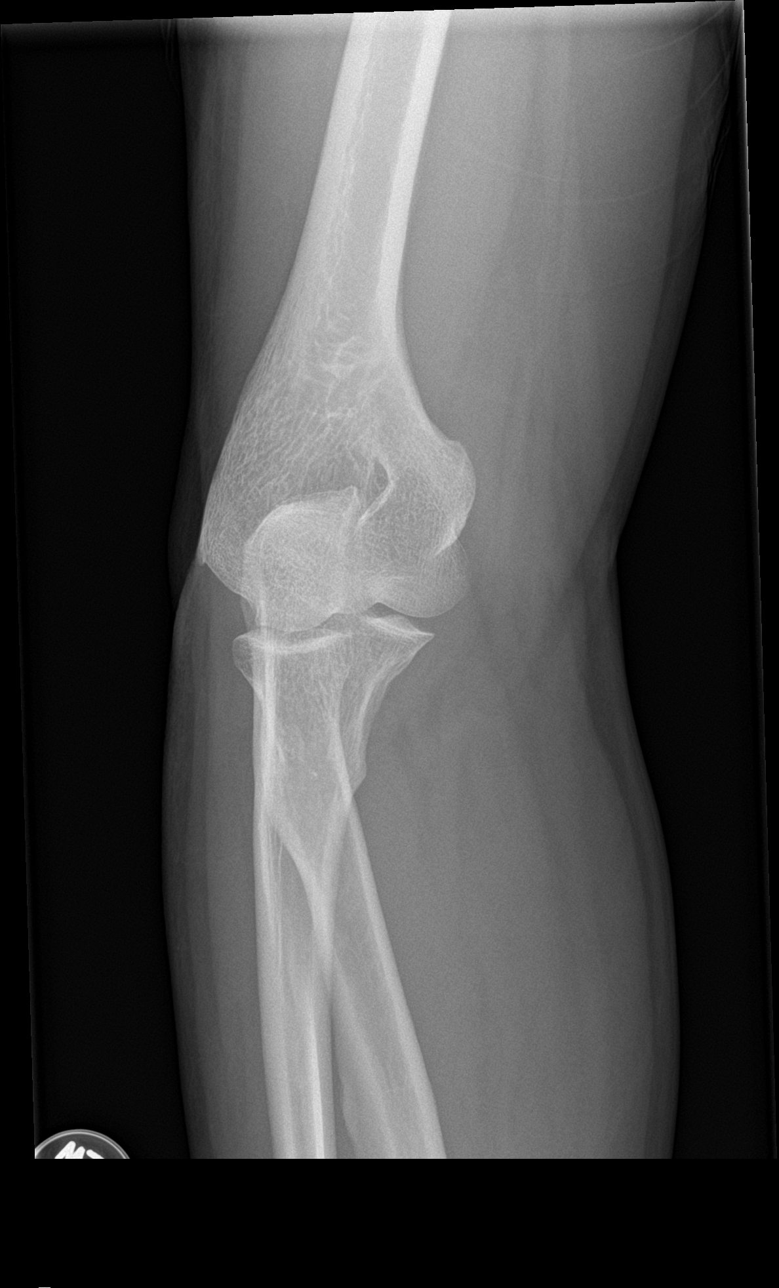

[elbow lat]
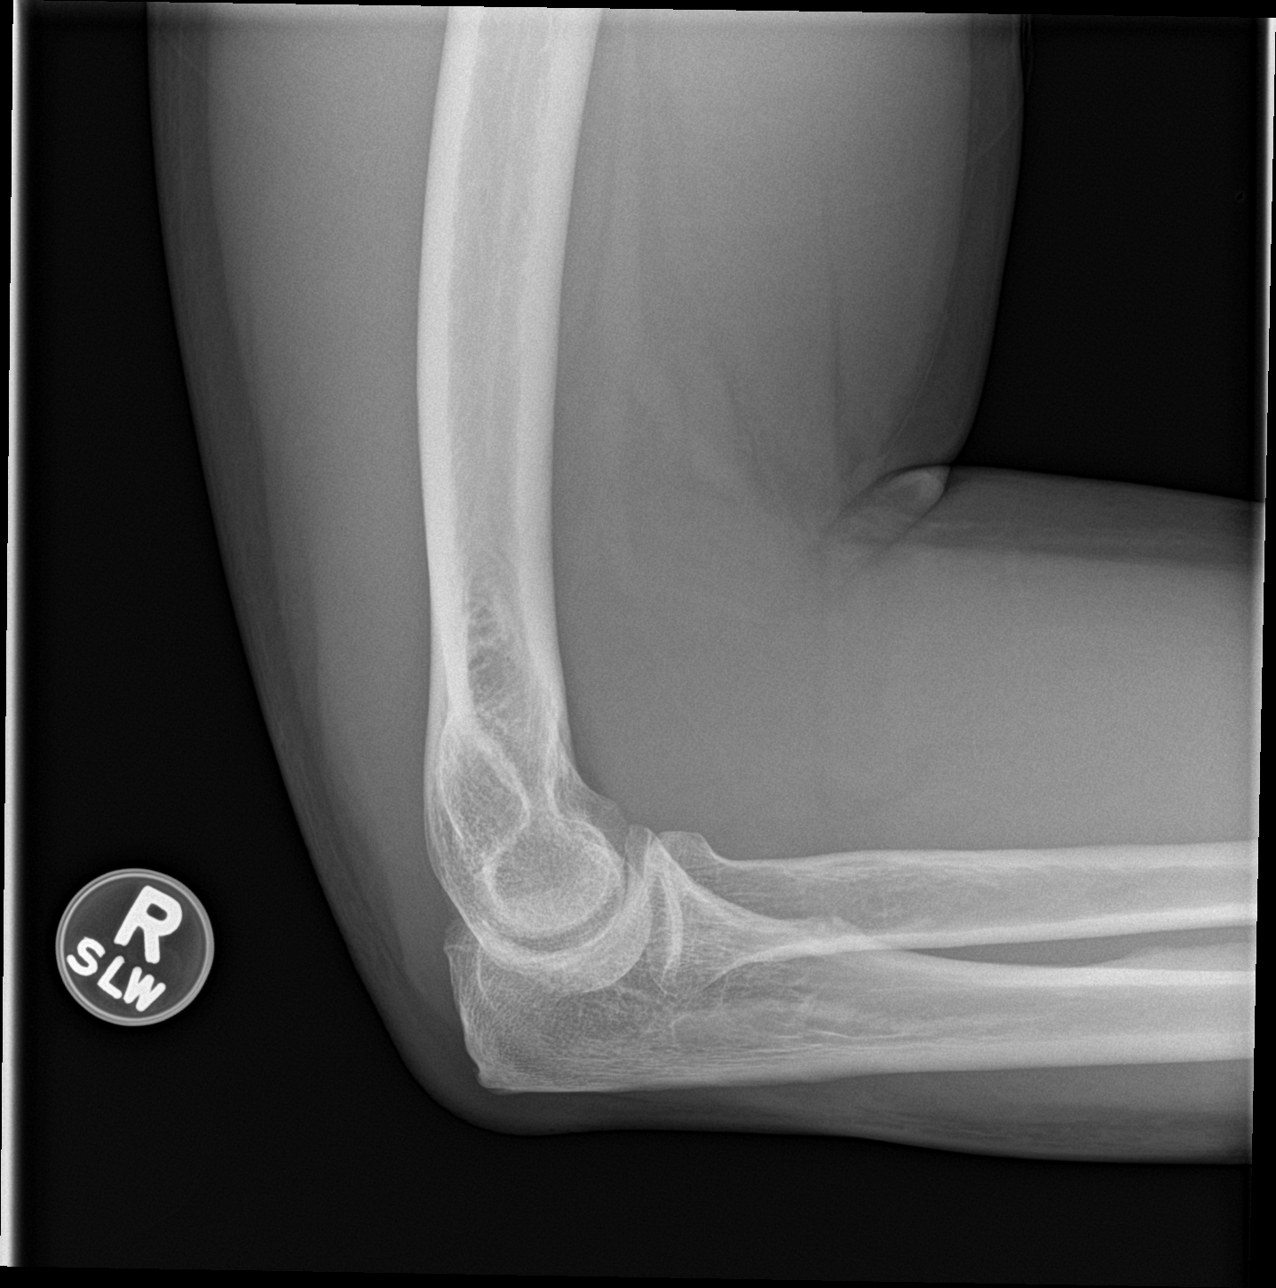

[4 of 4 positions shown; findings below may reference images not displayed]

FINDINGS: No evidence of fracture or dislocation.
IMPRESSION: No acute abnormality .
